# Patient Record
Sex: Male | Born: 1970 | Race: White | Hispanic: No | Marital: Married | Smoking: Never smoker
Health system: Southern US, Community
[De-identification: ages and names within clinical notes are randomized; demographics above are authoritative.]

## PROBLEM LIST (undated history)

## (undated) DIAGNOSIS — I10 Essential (primary) hypertension: Secondary | ICD-10-CM

## (undated) DIAGNOSIS — K219 Gastro-esophageal reflux disease without esophagitis: Secondary | ICD-10-CM

---

## 2013-09-11 ENCOUNTER — Other Ambulatory Visit: Payer: Self-pay

## 2013-11-29 ENCOUNTER — Emergency Department (HOSPITAL_COMMUNITY): Payer: 59

## 2013-11-29 ENCOUNTER — Emergency Department (HOSPITAL_COMMUNITY)
Admission: EM | Admit: 2013-11-29 | Discharge: 2013-11-29 | Disposition: A | Discharge status: Home / Self Care | Payer: 59 | Attending: Emergency Medicine | Admitting: Emergency Medicine

## 2013-11-29 ENCOUNTER — Encounter (HOSPITAL_COMMUNITY): Payer: Self-pay | Admitting: Emergency Medicine

## 2013-11-29 DIAGNOSIS — M25519 Pain in unspecified shoulder: Secondary | ICD-10-CM | POA: Insufficient documentation

## 2013-11-29 DIAGNOSIS — K219 Gastro-esophageal reflux disease without esophagitis: Secondary | ICD-10-CM | POA: Insufficient documentation

## 2013-11-29 DIAGNOSIS — R079 Chest pain, unspecified: Secondary | ICD-10-CM | POA: Insufficient documentation

## 2013-11-29 DIAGNOSIS — Z79899 Other long term (current) drug therapy: Secondary | ICD-10-CM | POA: Insufficient documentation

## 2013-11-29 DIAGNOSIS — I10 Essential (primary) hypertension: Secondary | ICD-10-CM | POA: Insufficient documentation

## 2013-11-29 DIAGNOSIS — R0789 Other chest pain: Secondary | ICD-10-CM | POA: Insufficient documentation

## 2013-11-29 DIAGNOSIS — M542 Cervicalgia: Secondary | ICD-10-CM | POA: Insufficient documentation

## 2013-11-29 HISTORY — DX: Essential (primary) hypertension: I10

## 2013-11-29 HISTORY — DX: Gastro-esophageal reflux disease without esophagitis: K21.9

## 2013-11-29 LAB — CBC
HCT: 49.2 % (ref 39.0–52.0)
HEMOGLOBIN: 18.1 g/dL — AB (ref 13.0–17.0)
MCH: 33.1 pg (ref 26.0–34.0)
MCHC: 36.8 g/dL — ABNORMAL HIGH (ref 30.0–36.0)
MCV: 89.9 fL (ref 78.0–100.0)
Platelets: 181 10*3/uL (ref 150–400)
RBC: 5.47 MIL/uL (ref 4.22–5.81)
RDW: 12.6 % (ref 11.5–15.5)
WBC: 7.4 10*3/uL (ref 4.0–10.5)

## 2013-11-29 LAB — BASIC METABOLIC PANEL
BUN: 8 mg/dL (ref 6–23)
CALCIUM: 9.2 mg/dL (ref 8.4–10.5)
CO2: 24 meq/L (ref 19–32)
Chloride: 101 mEq/L (ref 96–112)
Creatinine, Ser: 0.88 mg/dL (ref 0.50–1.35)
GFR calc Af Amer: >90 mL/min (ref >90)
GFR calc non Af Amer: >90 mL/min (ref >90)
GLUCOSE: 91 mg/dL (ref 70–99)
Potassium: 4.1 mEq/L (ref 3.7–5.3)
SODIUM: 139 meq/L (ref 137–147)

## 2013-11-29 LAB — POCT I-STAT TROPONIN I: Troponin i, poc: 0.01 ng/mL (ref 0.00–0.08)

## 2013-11-29 NOTE — ED Notes (Signed)
Rt shoulder pain since last week and tightness on left side started yesterday  No n/v/sob

## 2013-11-29 NOTE — Discharge Instructions (Signed)
Take an aspirin daily.  Chest Pain (Nonspecific) It is often hard to give a specific diagnosis for the cause of chest pain. There is always a chance that your pain could be related to something serious, such as a heart attack or a blood clot in the lungs. You need to follow up with your caregiver for further evaluation. CAUSES   Heartburn.  Pneumonia or bronchitis.  Anxiety or stress.  Inflammation around your heart (pericarditis) or lung (pleuritis or pleurisy).  A blood clot in the lung.  A collapsed lung (pneumothorax). It can develop suddenly on its own (spontaneous pneumothorax) or from injury (trauma) to the chest.  Shingles infection (herpes zoster virus). The chest wall is composed of bones, muscles, and cartilage. Any of these can be the source of the pain.  The bones can be bruised by injury.  The muscles or cartilage can be strained by coughing or overwork.  The cartilage can be affected by inflammation and become sore (costochondritis). DIAGNOSIS  Lab tests or other studies, such as X-rays, electrocardiography, stress testing, or cardiac imaging, may be needed to find the cause of your pain.  TREATMENT   Treatment depends on what may be causing your chest pain. Treatment may include:  Acid blockers for heartburn.  Anti-inflammatory medicine.  Pain medicine for inflammatory conditions.  Antibiotics if an infection is present.  You may be advised to change lifestyle habits. This includes stopping smoking and avoiding alcohol, caffeine, and chocolate.  You may be advised to keep your head raised (elevated) when sleeping. This reduces the chance of acid going backward from your stomach into your esophagus.  Most of the time, nonspecific chest pain will improve within 2 to 3 days with rest and mild pain medicine. HOME CARE INSTRUCTIONS   If antibiotics were prescribed, take your antibiotics as directed. Finish them even if you start to feel better.  For the  next few days, avoid physical activities that bring on chest pain. Continue physical activities as directed.  Do not smoke.  Avoid drinking alcohol.  Only take over-the-counter or prescription medicine for pain, discomfort, or fever as directed by your caregiver.  Follow your caregiver's suggestions for further testing if your chest pain does not go away.  Keep any follow-up appointments you made. If you do not go to an appointment, you could develop lasting (chronic) problems with pain. If there is any problem keeping an appointment, you must call to reschedule. SEEK MEDICAL CARE IF:   You think you are having problems from the medicine you are taking. Read your medicine instructions carefully.  Your chest pain does not go away, even after treatment.  You develop a rash with blisters on your chest. SEEK IMMEDIATE MEDICAL CARE IF:   You have increased chest pain or pain that spreads to your arm, neck, jaw, back, or abdomen.  You develop shortness of breath, an increasing cough, or you are coughing up blood.  You have severe back or abdominal pain, feel nauseous, or vomit.  You develop severe weakness, fainting, or chills.  You have a fever. THIS IS AN EMERGENCY. Do not wait to see if the pain will go away. Get medical help at once. Call your local emergency services (911 in [place] Do not drive yourself to the hospital. MAKE SURE YOU:   Understand these instructions.  Will watch your condition.  Will get help right away if you are not doing well or get worse. Document Released: 07/29/2005 Document Revised: 01/11/2012 Document Reviewed: 05/24/2008  ExitCare® Patient Information ©2014 [employer]. ° °

## 2013-11-29 NOTE — ED Provider Notes (Signed)
Medical screening examination/treatment/procedure(s) were performed by non-physician practitioner and as supervising physician I was immediately available for consultation/collaboration.  EKG Interpretation    Date/Time:  Wednesday November 29 2013 11:50:11 EST Ventricular Rate:  105 PR Interval:  164 QRS Duration: 82 QT Interval:  334 QTC Calculation: 441 R Axis:   60 Text Interpretation:  Sinus tachycardia with Fusion complexes Otherwise normal ECG Confirmed by DOCHERTY  MD, MEGAN ([id number]) on 11/29/2013 8:14:32 PM              Shanna CiscoMegan E Docherty, MD 11/29/13 2014

## 2013-11-29 NOTE — ED Provider Notes (Signed)
CSN: [id number]     Arrival date & time 11/29/13  1147 History   First MD Initiated Contact with Patient 11/29/13 1237     Chief Complaint  Patient presents with  . Shoulder Pain  . Chest Pain   (Consider location/radiation/quality/duration/timing/severity/associated sxs/prior Treatment) HPI Comments: Patient is a 43 year old male with a past medical history of hypertension and acid reflux who presents to the emergency department complaining of chest tightness x2 days. He describes the chest tightness as a "tickle" on the left side of his chest it is constant. Nothing in specific makes it worse or better. Denies worsening symptoms with inspiration. Denies shortness of breath, nausea, vomiting or diaphoresis. Symptoms are nonradiating. Patient also reports one week ago he had right-sided shoulder soreness beginning while moving a heavy smoker, worse with arm movement. 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 That symptom has since subsided. Denies ever having chest pain like this in the past. Regarding his elevated blood pressure, patient states his blood pressure usually runs around 150/100, he is taking his blood pressure medications as prescribed. States the symptom of chest pain as having a very anxious at this time. No family history of early heart disease. No family history of blood clots. Patient is a nonsmoker but breathes in a lot of smoke from an outdoor grill. Denies calf pain or tenderness.  Patient is a 43 y.o. male presenting with shoulder pain and chest pain. The history is provided by the patient.  Shoulder Pain Associated symptoms include chest pain.  Chest Pain   Past Medical History  Diagnosis Date  . Hypertension   . Acid reflux    History reviewed. No pertinent past surgical history. No family history on file. History  Substance Use Topics  . Smoking status: Never Smoker   . Smokeless tobacco: Not on file  . Alcohol Use: Yes     Review of Systems  Cardiovascular: Positive for chest pain.  Musculoskeletal:       Right shoulder pain, subsided.  All other systems reviewed and are negative.    Allergies  Review of patient's allergies indicates no known allergies.  Home Medications   Current Outpatient Rx  Name  Route  Sig  Dispense  Refill  . atorvastatin (LIPITOR) 40 MG tablet   Oral   Take 40 mg by mouth every morning.         Marland Kitchen ibuprofen (ADVIL) 200 MG tablet   Oral   Take 400 mg by mouth every 8 (eight) hours as needed (pain).          Marland Kitchen olmesartan (BENICAR) 40 MG tablet   Oral   Take 40 mg by mouth every morning.         Marland Kitchen omeprazole (PRILOSEC) 40 MG capsule   Oral   Take 40 mg by mouth 2 (two) times daily as needed (reflux).           BP 162/96  Pulse 74  Temp(Src) 99.2 F (37.3 C) (Oral)  Resp 18  SpO2 99% Physical Exam  Nursing note and vitals reviewed. Constitutional: He is oriented to person, place, and time. He appears well-developed and well-nourished. No distress.  HENT:  Head: Normocephalic and atraumatic.  Mouth/Throat: Oropharynx is clear and moist.  Eyes: Conjunctivae and EOM are normal. Pupils are equal, round, and reactive to light.  Neck: Normal range of motion. Neck supple.  Cardiovascular: Normal rate, regular rhythm, normal heart sounds  and intact distal pulses.   No extremity edema.  Pulmonary/Chest: Effort normal and breath sounds normal. No respiratory distress. He exhibits no tenderness.  Abdominal: Soft. Bowel sounds are normal. There is no tenderness.  Musculoskeletal: Normal range of motion. He exhibits no edema.  Right shoulder non-tender, normal ROM.  Neurological: He is alert and oriented to person, place, and time. He has normal strength. No sensory deficit.  Skin: Skin is warm and dry. He is not diaphoretic.  Psychiatric: He has a normal mood and affect. His behavior is normal.    ED Course  Procedures (including critical care time) Labs  Review Labs Reviewed  CBC - Abnormal; Notable for the following:    Hemoglobin 18.1 (*)    MCHC 36.8 (*)    All other components within normal limits  BASIC METABOLIC PANEL  POCT I-STAT TROPONIN I   Imaging Review Dg Chest 2 View  11/29/2013   CLINICAL DATA:  Chest pain.  EXAM: CHEST  2 VIEW  COMPARISON:  None.  FINDINGS: The lungs are adequately inflated. There is no focal infiltrate. The interstitial markings are mildly prominent bilaterally but there are no previous studies with which to compare. The lung markings are coarse in the lower retrocardiac region on the lateral film. These are not clearly evident on the frontal film and are likely due to prominent vascularity. There is no pleural effusion or pneumothorax. The cardiac silhouette is normal in size. The pulmonary vascularity is not engorged. The mediastinum is normal in width. The observed portions of the bony thorax appear normal.  IMPRESSION: There is no evidence of CHF nor pneumonia. Minimal prominence of the interstitial markings bilaterally is nonspecific and may reflect a smoking history if any. One cannot exclude acute bronchitis in the appropriate clinical setting.   Electronically Signed   By: David  SwazilandJordan   On: 11/29/2013 14:15    EKG Interpretation   None       MDM   1. Chest pain    Presenting with left-sided chest pain described as "tingling". He is well appearing and in no apparent distress. Hypertensive on arrival, 162/96 after sitting for a while. Patient states his pain is "not that bad" and believes he is over thinking it causing himself anxiety. He is low risk heart score of 1. He was tachycardic on his EKG, however on my exam his pulse was 75 to 85. PERC negative. Troponin negative. Chest x-ray showing mild prominence of interstitial markings, he does not have any symptoms of bronchitis, nonsmoker. He is stable for discharge home with followup per primary care physician. I advised him to discuss possible change  of blood pressure medications. Return precautions given. Patient states understanding of treatment care plan and is agreeable.     Trevor MaceRobyn M Albert, PA-C 11/29/13 [phone]

## 2021-11-20 ENCOUNTER — Other Ambulatory Visit: Payer: Self-pay

## 2021-11-20 ENCOUNTER — Other Ambulatory Visit (HOSPITAL_COMMUNITY): Payer: Self-pay | Admitting: Emergency Medicine

## 2021-11-20 ENCOUNTER — Ambulatory Visit (HOSPITAL_COMMUNITY)
Admission: RE | Admit: 2021-11-20 | Discharge: 2021-11-20 | Disposition: A | Discharge status: Home / Self Care | Payer: BC Managed Care – PPO | Source: Ambulatory Visit | Attending: Emergency Medicine | Admitting: Emergency Medicine

## 2021-11-20 DIAGNOSIS — R42 Dizziness and giddiness: Secondary | ICD-10-CM | POA: Insufficient documentation

## 2021-11-20 DIAGNOSIS — R0602 Shortness of breath: Secondary | ICD-10-CM | POA: Insufficient documentation

## 2021-11-20 MED ORDER — IOHEXOL 350 MG/ML SOLN
100.0000 mL | Freq: Once | INTRAVENOUS | Status: AC | PRN
  Administered 2021-11-20: 100 mL via INTRAVENOUS

## 2021-12-19 ENCOUNTER — Ambulatory Visit (HOSPITAL_BASED_OUTPATIENT_CLINIC_OR_DEPARTMENT_OTHER): Payer: BC Managed Care – PPO | Admitting: Cardiology

## 2021-12-26 ENCOUNTER — Ambulatory Visit (HOSPITAL_BASED_OUTPATIENT_CLINIC_OR_DEPARTMENT_OTHER): Payer: BC Managed Care – PPO | Admitting: Cardiology

## 2021-12-26 ENCOUNTER — Encounter (HOSPITAL_BASED_OUTPATIENT_CLINIC_OR_DEPARTMENT_OTHER): Payer: Self-pay | Admitting: Cardiology

## 2021-12-26 ENCOUNTER — Other Ambulatory Visit: Payer: Self-pay

## 2021-12-26 VITALS — BP 168/100 | HR 100 | Ht 71.0 in | Wt 275.4 lb

## 2021-12-26 DIAGNOSIS — Z5181 Encounter for therapeutic drug level monitoring: Secondary | ICD-10-CM

## 2021-12-26 DIAGNOSIS — E669 Obesity, unspecified: Secondary | ICD-10-CM

## 2021-12-26 DIAGNOSIS — Z7189 Other specified counseling: Secondary | ICD-10-CM | POA: Diagnosis not present

## 2021-12-26 DIAGNOSIS — I10 Essential (primary) hypertension: Secondary | ICD-10-CM | POA: Diagnosis not present

## 2021-12-26 DIAGNOSIS — E782 Mixed hyperlipidemia: Secondary | ICD-10-CM

## 2021-12-26 DIAGNOSIS — R002 Palpitations: Secondary | ICD-10-CM | POA: Diagnosis not present

## 2021-12-26 DIAGNOSIS — E1169 Type 2 diabetes mellitus with other specified complication: Secondary | ICD-10-CM

## 2021-12-26 MED ORDER — SPIRONOLACTONE 25 MG PO TABS: 25.0000 mg | ORAL_TABLET | Freq: Every day | ORAL | 3 refills | Status: DC

## 2021-12-26 MED ORDER — CHLORTHALIDONE 25 MG PO TABS: 25.0000 mg | ORAL_TABLET | Freq: Every day | ORAL | 3 refills | Status: DC

## 2021-12-26 NOTE — Progress Notes (Incomplete)
Cardiology Office Note:    Date:  12/26/2021   ID:  Scott Foster, DOB [birth year], MRN [id number]  PCP:  Gwenlyn Found, MD  Cardiologist:  Jodelle Red, MD  Referring MD: Gwenlyn Found, MD   No chief complaint on file.  History of Present Illness:    Scott Foster is a 51 y.o. male with a hx of hypertension, arthritis, and acid reflux, who is seen as a new consult at the request of [name], Nira Retort, MD for the evaluation and management of palpitations.  Cardiac history: [name] presented to the [facility] ED on 11/20/2021 for palpitations. He also noted leg weakness, shortness of breath, dizziness, tachycardia, and abdominal tenderness on examination. It was recommended he follow-up with cardiology due to his palpitations.  Overall, he is mostly concerned that his blood pressure is not staying consistent. Sometimes he checks this at home, but most of the time he is able to tell such as when he wakes up with throbbing headaches. His high blood pressure also occurs randomly during the day. Today his high blood pressure was noticeable for 3 distinct episodes. He is not sure how long this lasts typically.   His hypertensive episodes have become more frequent in the past few months, but did occur from time to time before then. This type of episode had prompted his ED visit 11/20/2021. He explains that his other symptoms were subsequent to his hypertension and headaches.  Cardiovascular risk factors: Prior clinical ASCVD: None Comorbid conditions: Hypertension - On antihypertensives since 2005. Currently he is taking Azor, and he does not take Benicar. Also on metoprolol and 50 mg HCTZ. He denies ever struggling with very low blood pressures. Diabetes - A little over a year. Metabolic syndrome/Obesity: Highest adult weight 290 lbs. Current weight is 275 lbs. He went off of Trulicity due to side effects, and had lost weight down to 260 lbs. He regained weight by eating too much  as he no longer felt nausea caused by the Trulicity. Chronic inflammatory conditions: Tobacco use history: Former smoker, quit in 1992. Family history: HTN, diabetes. His paternal grandfather died of a stroke in his 58s. His maternal uncle died in his 45s due to a stroke.  Prior cardiac testing and/or incidental findings on other testing (ie coronary calcium): ECG 11/20/2021 from [facility] showed Sinus tachycardia at 108 bpm with no acute ischemic changes. Cardiomegaly noted on CTA of chest the same day. Troponins reassuring and flat. Attempted stress test previously, unable to complete due to pain in his hamstrings. Exercise level: Trying to work on increasing his exercise. Sometimes his leg weakness significantly limits his walking. He takes Advil and Aleve very sparingly. Current diet:   He endorses mild LE edema at the ankles. This was more of an issue in the past. He does well with monitoring his sodium intake.  He denies any chest pain, or shortness of breath. No lightheadedness, syncope, orthopnea, or PND.  Past Medical History:  Diagnosis Date   Acid reflux    Hypertension     History reviewed. No pertinent surgical history.  Current Medications: Current Outpatient Medications on File Prior to Visit  Medication Sig   ALPRAZolam (XANAX) 0.25 MG tablet Take 0.25 mg by mouth 2 (two) times daily as needed.   amLODipine-olmesartan (AZOR) 10-40 MG tablet Take 1 tablet by mouth daily.   atorvastatin (LIPITOR) 80 MG tablet Take 80 mg by mouth at bedtime.   Cyanocobalamin 1000 MCG SUBL Place under the  tongue.   metoprolol tartrate (LOPRESSOR) 25 MG tablet Take 1 tablet by mouth 2 (two) times daily.   omeprazole (PRILOSEC) 40 MG capsule Take 40 mg by mouth 2 (two) times daily as needed (reflux).    ondansetron (ZOFRAN-ODT) 8 MG disintegrating tablet Take 8 mg by mouth every 8 (eight) hours as needed.   pioglitazone (ACTOS) 15 MG tablet Take by mouth.   No current  facility-administered medications on file prior to visit.     Allergies:   Patient has no known allergies.   Social History   Tobacco Use   Smoking status: Never  Substance Use Topics   Alcohol use: Yes    Family History: family history is not on file.  ROS:   Please see the history of present illness.  Additional pertinent ROS: Constitutional: Negative for chills, fever, night sweats, unintentional weight loss  HENT: Negative for ear pain and hearing loss. Positive for headaches.  Eyes: Negative for loss of vision and eye pain.  Respiratory: Negative for cough, sputum, wheezing.   Cardiovascular: See HPI. Gastrointestinal: Negative for abdominal pain, melena, and hematochezia.  Genitourinary: Negative for dysuria and hematuria.  Musculoskeletal: Negative for falls and myalgias.  Skin: Negative for itching and rash.  Neurological: Negative for focal sensory changes and loss of consciousness. Positive for bilateral LE weakness. Endo/Heme/Allergies: Does not bruise/bleed easily.     EKGs/Labs/Other Studies Reviewed:    The following studies were reviewed today:  CTA Chest 11/20/2021: FINDINGS: Cardiovascular: Examination for pulmonary embolism is somewhat limited by breath motion artifact and marginal contrast bolus, main pulmonary artery = 200 HU. Within this limitation, no evidence of pulmonary embolism through the segmental pulmonary arterial level. Cardiomegaly. No pericardial effusion. Aortic atherosclerosis.   Mediastinum/Nodes: No enlarged mediastinal, hilar, or axillary lymph nodes. Thyroid gland, trachea, and esophagus demonstrate no significant findings.   Lungs/Pleura: Lungs are clear. No pleural effusion or pneumothorax.   Upper Abdomen: Please see separately reported examination of the abdomen and pelvis.   Musculoskeletal: No chest wall abnormality. No acute osseous findings.   Review of the MIP images confirms the above findings.   IMPRESSION: 1.  Examination for pulmonary embolism is somewhat limited by breath motion artifact and marginal contrast bolus, main pulmonary artery = 200 HU. Within this limitation, no evidence of pulmonary embolism through the segmental pulmonary arterial level. 2. Cardiomegaly.   Aortic Atherosclerosis (ICD10-I70.0).  EKG:  EKG is personally reviewed.   12/26/2021: ***  Recent Labs: No results found for requested labs within last 8760 hours.   Recent Lipid Panel No results found for: CHOL, TRIG, HDL, CHOLHDL, VLDL, LDLCALC, LDLDIRECT  Physical Exam:    VS:  BP (!) 168/100 (BP Location: Right Arm, Patient Position: Sitting, Cuff Size: Large)    Pulse 100    Ht 5\' 11"  (1.803 m)    Wt 275 lb 6.4 oz (124.9 kg)    SpO2 95%    BMI 38.41 kg/m     Wt Readings from Last 3 Encounters:  12/26/21 275 lb 6.4 oz (124.9 kg)    GEN: Well nourished, well developed in no acute distress HEENT: Normal, moist mucous membranes NECK: No JVD CARDIAC: regular rhythm, normal S1 and S2, no rubs or gallops. No murmur. VASCULAR: Radial and DP pulses 2+ bilaterally. No carotid bruits RESPIRATORY:  Clear to auscultation without rales, wheezing or rhonchi  ABDOMEN: Soft, non-tender, non-distended MUSCULOSKELETAL:  Ambulates independently SKIN: Warm and dry, no edema NEUROLOGIC:  Alert and oriented x 3. No focal  neuro deficits noted. PSYCHIATRIC:  Normal affect    ASSESSMENT:    1. Resistant hypertension   2. Therapeutic drug monitoring    PLAN:     Cardiac risk counseling and prevention recommendations: -recommend heart healthy/Mediterranean diet, with whole grains, fruits, vegetable, fish, lean meats, nuts, and olive oil. Limit salt. -recommend moderate walking, 3-5 times/week for 30-50 minutes each session. Aim for at least 150 minutes.week. Goal should be pace of 3 miles/hours, or walking 1.5 miles in 30 minutes -recommend avoidance of tobacco products. Avoid excess alcohol. -ASCVD risk score: The 10-year  ASCVD risk score (Arnett DK, et al., 2019) is: 14.5%   Values used to calculate the score:     Age: 51 years     Sex: Male     Is Non-Hispanic African American: No     Diabetic: Yes     Tobacco smoker: No     Systolic Blood Pressure: [id number] mmHg     Is BP treated: Yes     HDL Cholesterol: 39 MG/DL     Total Cholesterol: 164 MG/DL    Plan for follow up: 4-6 weeks or sooner as needed.  Buford Dresser, MD, PhD, [facility]    Medication Adjustments/Labs and Tests Ordered: Current medicines are reviewed at length with the patient today.  Concerns regarding medicines are outlined above.   Orders Placed This Encounter  Procedures   Basic Metabolic Panel (BMET)   EKG 12-Lead   Meds ordered this encounter  Medications   chlorthalidone (HYGROTON) 25 MG tablet    Sig: Take 1 tablet (25 mg total) by mouth daily.    Dispense:  90 tablet    Refill:  3   spironolactone (ALDACTONE) 25 MG tablet    Sig: Take 1 tablet (25 mg total) by mouth daily.    Dispense:  90 tablet    Refill:  3   Patient Instructions  From Today: Stop hydrochlorothiazide. We are starting chlorthalidone in its place. Stop potassium. We are starting spironolactone, which helps with potassium.  LABS (BMET) IN 1 WEEK   FOLLOW UP 02/02/2022 AT 4:20 PM WITH DR Harrell Gave   -how to check blood pressure:  -sit comfortably in a chair, feet uncrossed and flat on floor, for 5-10 minutes  -arm ideally should rest at the level of the heart. However, arm should be relaxed and not tense (for example, do not hold the arm up unsupported)  -avoid exercise, caffeine, and tobacco for at least 30 minutes prior to BP reading  -don't take BP cuff reading over clothes (always place on skin directly)  -I prefer to know how well the medication is working, so I would like you to take your readings 1-2 hours after taking your blood pressure medication if possible   I like arm cuffs better than wrist cuffs for  checking blood pressure, they are more accurate.   I,Mathew Stumpf,acting as a Education administrator for [employer], MD.,have documented all relevant documentation on the behalf of Buford Dresser, MD,as directed by  Buford Dresser, MD while in the presence of Buford Dresser, MD.  ***  Signed, Buford Dresser, MD PhD 12/26/2021 5:42 PM    [place]

## 2021-12-26 NOTE — Patient Instructions (Addendum)
From Today: Stop hydrochlorothiazide. We are starting chlorthalidone in its place. Stop potassium. We are starting spironolactone, which helps with potassium.  LABS (BMET) IN 1 WEEK   FOLLOW UP 02/02/2022 AT 4:20 PM WITH DR Cristal Deer   -how to check blood pressure:  -sit comfortably in a chair, feet uncrossed and flat on floor, for 5-10 minutes  -arm ideally should rest at the level of the heart. However, arm should be relaxed and not tense (for example, do not hold the arm up unsupported)  -avoid exercise, caffeine, and tobacco for at least 30 minutes prior to BP reading  -don't take BP cuff reading over clothes (always place on skin directly)  -I prefer to know how well the medication is working, so I would like you to take your readings 1-2 hours after taking your blood pressure medication if possible   I like arm cuffs better than wrist cuffs for checking blood pressure, they are more accurate.

## 2022-01-17 LAB — BASIC METABOLIC PANEL
BUN/Creatinine Ratio: 21 — ABNORMAL HIGH (ref 9–20)
BUN: 16 mg/dL (ref 6–24)
CO2: 25 mmol/L (ref 20–29)
Calcium: 10.1 mg/dL (ref 8.7–10.2)
Chloride: 97 mmol/L (ref 96–106)
Creatinine, Ser: 0.78 mg/dL (ref 0.76–1.27)
Glucose: 137 mg/dL — ABNORMAL HIGH (ref 70–99)
Potassium: 3.8 mmol/L (ref 3.5–5.2)
Sodium: 136 mmol/L (ref 134–144)
eGFR: 107 mL/min/{1.73_m2} (ref >59)

## 2022-02-02 ENCOUNTER — Ambulatory Visit (HOSPITAL_BASED_OUTPATIENT_CLINIC_OR_DEPARTMENT_OTHER): Payer: BC Managed Care – PPO | Admitting: Cardiology

## 2022-02-02 ENCOUNTER — Encounter (HOSPITAL_BASED_OUTPATIENT_CLINIC_OR_DEPARTMENT_OTHER): Payer: Self-pay | Admitting: Cardiology

## 2022-02-02 VITALS — BP 119/70 | HR 97 | Ht 71.0 in | Wt 276.3 lb

## 2022-02-02 DIAGNOSIS — Z7189 Other specified counseling: Secondary | ICD-10-CM | POA: Diagnosis not present

## 2022-02-02 DIAGNOSIS — R600 Localized edema: Secondary | ICD-10-CM

## 2022-02-02 DIAGNOSIS — Z7182 Exercise counseling: Secondary | ICD-10-CM

## 2022-02-02 DIAGNOSIS — I10 Essential (primary) hypertension: Secondary | ICD-10-CM | POA: Diagnosis not present

## 2022-02-02 DIAGNOSIS — E669 Obesity, unspecified: Secondary | ICD-10-CM

## 2022-02-02 DIAGNOSIS — E782 Mixed hyperlipidemia: Secondary | ICD-10-CM

## 2022-02-02 DIAGNOSIS — E1169 Type 2 diabetes mellitus with other specified complication: Secondary | ICD-10-CM

## 2022-02-02 NOTE — Patient Instructions (Signed)
Medication Instructions:  ?Your Physician recommend you continue on your current medication as directed.   ? ?*If you need a refill on your cardiac medications before your next appointment, please call your pharmacy* ? ? ?Lab Work: ?Your provider has recommended lab work in October, 2023 (Lipid). Please have this collected at [facility] at [place]. The lab is open 8:00 am - 4:30 pm. Please avoid 12:00p - 1:00p for lunch hour. You do not need an appointment. Please go to [address] [place] [facility], [place] [address]. This is in the Primary Care office on the 3rd floor, let them know you are there for blood work and they will direct you to the lab. ? ?If you have labs (blood work) drawn today and your tests are completely normal, you will receive your results only by: ?MyChart Message (if you have MyChart) OR ?A paper copy in the mail ?If you have any lab test that is abnormal or we need to change your treatment, we will call you to review the results. ? ? ?Testing/Procedures: ?None ordered today ? ? ?Follow-Up: ?At [facility], you and your health needs are our priority.  As part of our continuing mission to provide you with exceptional heart care, we have created designated Provider Care Teams.  These Care Teams include your primary Cardiologist (physician) and Advanced Practice Providers (APPs -  Physician Assistants and Nurse Practitioners) who all work together to provide you with the care you need, when you need it. ? ?We recommend signing up for the patient portal called "MyChart".  Sign up information is provided on this After Visit Summary.  MyChart is used to connect with patients for Virtual Visits (Telemedicine).  Patients are able to view lab/test results, encounter notes, upcoming appointments, etc.  Non-urgent messages can be sent to your provider as well.   ?To learn more about what you can do with MyChart, go to [email or web address].   ? ?Your next appointment:   ?1  year(s) ? ?The format for your next appointment:   ?In Person ? ?Provider:   ?Buford Dresser, MD{ ? ? ?

## 2022-02-02 NOTE — Progress Notes (Signed)
?Cardiology Office Note:   ? ?Date:  02/02/2022  ? ?ID:  Scott Foster, DOB 12-30-1970, MRN [id number] ? ?PCP:  Nickola Major, MD  ?Cardiologist:  Buford Dresser, MD ? ?CC: follow up ? ?History of Present Illness:   ? ?Scott Foster is a 51 y.o. male with a hx of hypertension, arthritis, and acid reflux, who is seen for follow-up. I initially met him 12/26/2021 as a new consult for the evaluation and management of palpitations. His blood pressure was elevated at first visit, and he noted labile readings at home.  ? ?Cardiovascular risk factors: ?Hypertension since 2005 ?Diabetes since ~2022. Did not tolerate Trulicity (nausea) but did have about 30 lb weight loss due to side effects. Has also been on Jardiance in the past.  ?Former smoker, quit in 1992. ?Family history: HTN, diabetes. His paternal grandfather died of a stroke in his 60s. His maternal uncle died in his 45s due to a stroke.  ? ?Today: ?Overall,the patient is appears to be feeling well. He denies any recent palpitations. ? ?He reports that he is tolerating his medications well. About 3 weeks ago his diabetic medication was increased, causing side effects of nausea for 3 days. Sometimes he still notices nausea in the mornings, but taking Zofran helps relieve his symptoms.  ? ?The patient's current blood sugar averages 130. The patients last A1C was 7.8. A few months ago he had a binge on sugar and his blood sugars increased to 425. He denies being symptomatic at that time. Typically he tries to limit the sugar in his diet. ? ?He does not administer blood pressure checks at home in order to eliminate stress. His blood pressure is now in a normal range. Sometimes he feels "a little weird," but he attributes this to his body needing to adjust to lower blood pressures. 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 ? ?The patient is working hard to be active and diet more. He has continued issues with bilateral LE  weakness when being active. His current diet includes vegetables and meat.  ? ?From his previous blood testing his LDL and triglycerides were elevated although he fasted. He is tolerating atorvastatin. ? ?He denies any chest pain, shortness of breath, or peripheral edema. No lightheadedness, headaches, syncope, orthopnea, or PND. ? ? ?Past Medical History:  ?Diagnosis Date  ? Acid reflux   ? Hypertension   ? ? ?History reviewed. No pertinent surgical history. ? ?Current Medications: ?Current Outpatient Medications on File Prior to Visit  ?Medication Sig  ? ALPRAZolam (XANAX) 0.25 MG tablet Take 0.25 mg by mouth 2 (two) times daily as needed.  ? amLODipine-olmesartan (AZOR) 10-40 MG tablet Take 1 tablet by mouth daily.  ? atorvastatin (LIPITOR) 80 MG tablet Take 80 mg by mouth at bedtime.  ? chlorthalidone (HYGROTON) 25 MG tablet Take 1 tablet (25 mg total) by mouth daily.  ? Cyanocobalamin 1000 MCG SUBL Place under the tongue.  ? metoprolol tartrate (LOPRESSOR) 25 MG tablet Take 1 tablet by mouth 2 (two) times daily.  ? omeprazole (PRILOSEC) 40 MG capsule Take 40 mg by mouth 2 (two) times daily as needed (reflux).   ? ondansetron (ZOFRAN-ODT) 8 MG disintegrating tablet Take 8 mg by mouth every 8 (eight) hours as needed.  ? pioglitazone (ACTOS) 15 MG tablet Take by mouth.  ? spironolactone (ALDACTONE) 25 MG tablet Take 1 tablet (25 mg total) by mouth daily.  ? ?No current facility-administered medications on file prior  to visit.  ?  ? ?Allergies:   Patient has no known allergies.  ? ?Social History  ? ?Tobacco Use  ? Smoking status: Never  ?Substance Use Topics  ? Alcohol use: Yes  ? ? ?Family History: ?HTN, diabetes. His paternal grandfather died of a stroke in his 9s. His maternal uncle died in his 24s due to a stroke.  ? ?ROS:   ?Please see the history of present illness. ?All other systems are reviewed and negative.  ? ? ?EKGs/Labs/Other Studies Reviewed:   ? ?The following studies were reviewed today: ? ?CTA  Chest 11/20/2021: ?FINDINGS: ?Cardiovascular: Examination for pulmonary embolism is somewhat ?limited by breath motion artifact and marginal contrast bolus, main ?pulmonary artery = 200 HU. Within this limitation, no evidence of ?pulmonary embolism through the segmental pulmonary arterial level. ?Cardiomegaly. No pericardial effusion. Aortic atherosclerosis. ?  ?Mediastinum/Nodes: No enlarged mediastinal, hilar, or axillary lymph ?nodes. Thyroid gland, trachea, and esophagus demonstrate no ?significant findings. ?  ?Lungs/Pleura: Lungs are clear. No pleural effusion or pneumothorax. ?  ?Upper Abdomen: Please see separately reported examination of the ?abdomen and pelvis. ?  ?Musculoskeletal: No chest wall abnormality. No acute osseous ?findings. ?  ?Review of the MIP images confirms the above findings. ?  ?IMPRESSION: ?1. Examination for pulmonary embolism is somewhat limited by breath ?motion artifact and marginal contrast bolus, main pulmonary artery = ?200 HU. Within this limitation, no evidence of pulmonary embolism ?through the segmental pulmonary arterial level. ?2. Cardiomegaly. ?  ?Aortic Atherosclerosis (ICD10-I70.0). ? ?EKG:  EKG is personally reviewed.   ?02/02/2022: EKG was not ordered. ?12/26/2021: NSR at 100 bpm ? ?Recent Labs: ?01/16/2022: BUN 16; Creatinine, Ser 0.78; Potassium 3.8; Sodium 136  ? ?Recent Lipid Panel ?No results found for: CHOL, TRIG, HDL, CHOLHDL, VLDL, LDLCALC, LDLDIRECT ? ?Physical Exam:   ? ?VS:  BP 119/70   Pulse 97   Ht 5' 11"  (1.803 m)   Wt 276 lb 4.8 oz (125.3 kg)   SpO2 95%   BMI 38.54 kg/m?    ? ?Wt Readings from Last 3 Encounters:  ?02/02/22 276 lb 4.8 oz (125.3 kg)  ?12/26/21 275 lb 6.4 oz (124.9 kg)  ?  ?GEN: Well nourished, well developed in no acute distress ?HEENT: Normal, moist mucous membranes ?NECK: No JVD ?CARDIAC: regular rhythm, normal S1 and S2, no rubs or gallops. No murmur. ?VASCULAR: Radial and DP pulses 2+ bilaterally. No carotid bruits ?RESPIRATORY:  Clear  to auscultation without rales, wheezing or rhonchi  ?ABDOMEN: Soft, non-tender, non-distended ?MUSCULOSKELETAL:  Ambulates independently ?SKIN: Warm and dry, no edema ?NEUROLOGIC:  Alert and oriented x 3. No focal neuro deficits noted. ?PSYCHIATRIC:  Normal affect   ? ?ASSESSMENT:   ? ?1. Essential hypertension   ?2. Diabetes mellitus type 2 in obese [facility])   ?3. Mixed hyperlipidemia   ?4. Cardiac risk counseling   ?5. Exercise counseling   ?6. Bilateral leg edema   ? ?PLAN:   ? ?Hypertension ?-BP now normalized with changing HCTZ to chlorthalidone and adding spironolactone ?-continue amlodipine-olmesartan ? ?LE edema: improved today ?-pioglitazone is known to be associated with fluid retention, but he has not tolerated many other classes of diabetes agents. No known heart failure ? ?Type II diabetes ?Obesity, BMI 38 ?-did not tolerate Trulicty (HFW2OV) due to nausea. Reports being on Jardiance in the past, per notes no side effects but did not improve his blood sugar ?-discuss aspirin given diabetes ? ?Mixed hyperlipidemia: ?-on atorvastatin 80 mg daily ?-last LDL 40, but last  TG 683 07/25/21. Does report that he was fasting ?-counseled on diet and especially exercise ?-if TG remain elevated at 6 mos recheck, will need to discuss additional TG lowering therapies ? ?Cardiac risk counseling and prevention recommendations: ?-recommend heart healthy/Mediterranean diet, with whole grains, fruits, vegetable, fish, lean meats, nuts, and olive oil. Limit salt. ?-recommend moderate walking, 3-5 times/week for 30-50 minutes each session. Aim for at least 150 minutes.week. Goal should be pace of 3 miles/hours, or walking 1.5 miles in 30 minutes ?-recommend avoidance of tobacco products. Avoid excess alcohol. ?-ASCVD risk score: ?The 10-year ASCVD risk score (Arnett DK, et al., 2019) is: 8.1% ?  Values used to calculate the score: ?    Age: 29 years ?    Sex: Male ?    Is Non-Hispanic African American: No ?    Diabetic: Yes ?     Tobacco smoker: No ?    Systolic Blood Pressure: 561 mmHg ?    Is BP treated: Yes ?    HDL Cholesterol: 39 MG/DL ?    Total Cholesterol: 164 MG/DL   ? ?Plan for follow up: 1 year if BP/TG controlled, sooner if

## 2022-08-08 LAB — LIPID PANEL
Chol/HDL Ratio: 5.8 ratio — ABNORMAL HIGH (ref 0.0–5.0)
Cholesterol, Total: 210 mg/dL — ABNORMAL HIGH (ref 100–199)
HDL: 36 mg/dL — ABNORMAL LOW (ref >39)
LDL Chol Calc (NIH): 120 mg/dL — ABNORMAL HIGH (ref 0–99)
Triglycerides: 310 mg/dL — ABNORMAL HIGH (ref 0–149)
VLDL Cholesterol Cal: 54 mg/dL — ABNORMAL HIGH (ref 5–40)

## 2023-01-18 ENCOUNTER — Other Ambulatory Visit (HOSPITAL_BASED_OUTPATIENT_CLINIC_OR_DEPARTMENT_OTHER): Payer: Self-pay | Admitting: Cardiology

## 2023-01-18 DIAGNOSIS — I1A0 Resistant hypertension: Secondary | ICD-10-CM

## 2023-01-18 NOTE — Telephone Encounter (Signed)
Rx(s) sent to pharmacy electronically.  

## 2023-02-08 ENCOUNTER — Encounter (HOSPITAL_BASED_OUTPATIENT_CLINIC_OR_DEPARTMENT_OTHER): Payer: Self-pay | Admitting: Cardiology

## 2023-02-08 ENCOUNTER — Ambulatory Visit (HOSPITAL_BASED_OUTPATIENT_CLINIC_OR_DEPARTMENT_OTHER): Payer: BC Managed Care – PPO | Admitting: Cardiology

## 2023-02-08 VITALS — BP 110/72 | HR 85 | Ht 71.0 in | Wt 283.8 lb

## 2023-02-08 DIAGNOSIS — E1140 Type 2 diabetes mellitus with diabetic neuropathy, unspecified: Secondary | ICD-10-CM | POA: Diagnosis not present

## 2023-02-08 DIAGNOSIS — Z7189 Other specified counseling: Secondary | ICD-10-CM | POA: Diagnosis not present

## 2023-02-08 DIAGNOSIS — I10 Essential (primary) hypertension: Secondary | ICD-10-CM

## 2023-02-08 DIAGNOSIS — E782 Mixed hyperlipidemia: Secondary | ICD-10-CM

## 2023-02-08 NOTE — Patient Instructions (Addendum)
Medication Instructions:  Your physician recommends that you continue on your current medications as directed. Please refer to the Current Medication list given to you today.  *If you need a refill on your cardiac medications before your next appointment, please call your pharmacy*  Lab Work: NONE  Testing/Procedures: NONE  Follow-Up: At [facility], you and your health needs are our priority.  As part of our continuing mission to provide you with exceptional heart care, we have created designated Provider Care Teams.  These Care Teams include your primary Cardiologist (physician) and Advanced Practice Providers (APPs -  Physician Assistants and Nurse Practitioners) who all work together to provide you with the care you need, when you need it.  We recommend signing up for the patient portal called "MyChart".  Sign up information is provided on this After Visit Summary.  MyChart is used to connect with patients for Virtual Visits (Telemedicine).  Patients are able to view lab/test results, encounter notes, upcoming appointments, etc.  Non-urgent messages can be sent to your provider as well.   To learn more about what you can do with MyChart, go to [email or web address].    Your next appointment:   12  month(s)  The format for your next appointment:   In Person  Provider:   Jodelle Red, MD    Once your GI workup is complete, consider starting aspirin 81 mg daily for prevention given your diabetes.

## 2023-02-08 NOTE — Progress Notes (Signed)
Cardiology Office Note:    Date:  02/08/2023   ID:  Scott Foster, DOB 06/03/1971, MRN [id number]  PCP:  Gwenlyn FoundEksir, Samantha A, MD  Cardiologist:  Jodelle RedBridgette Mariamawit Depaoli, MD  CC: follow up  History of Present Illness:    Scott BurrowJimmy L Collums is a 52 y.o. male with a hx of hypertension, arthritis, and acid reflux, who is seen for follow-up. I initially met him 12/26/2021 as a new consult for the evaluation and management of palpitations. His blood pressure was elevated at first visit, and he noted labile readings at home.   Cardiovascular risk factors: Hypertension since 2005 Diabetes since ~2022. Did not tolerate Trulicity (nausea) but did have about 30 lb weight loss due to side effects. Has also been on Jardiance in the past.  Former smoker, quit in 1992. Family history: HTN, diabetes. His paternal grandfather died of a stroke in his 470s. His maternal uncle died in his 940s due to a stroke.   At his last appointment, he denied recent palpitations. His blood sugars averaged 130 and his A1C was 7.8. We discussed aspirin given diabetes. He was not monitoring home blood pressures in order to eliminate stress. His blood pressure was normalized with switching HCTZ to chlorthalidone, and adding spironolactone. He continued to struggle with LE weakness when active, but was working on increasing his exercise.   Today, he is feeling good. A few weeks ago he was started on Cymbalta. This seems to be helping with his neuropathy. However, he does note some minor changes in his sleep quality and he has experienced mild headaches.  His blood pressure is well controlled in clinic today at 110/72. He has had some slightly higher readings; he reports a reading of 135/90 at his last PCP visit. At home his blood pressures are mostly averaging 110-115 systolic. He notes that it was 107/71 prior to his colonoscopy in 12/2022.  He recalls that he was fasting for his lipid panel in October. At the time he had more meat in his  diet to address his GI issues. His last 3 incidents seemed to correlate with eating salads. He also reports that he had more recent lab work a few weeks ago. His triglycerides did improve significantly.  He complains of ED as a side effect of his medications.  He denies any palpitations, chest pain, shortness of breath, or peripheral edema. No syncope, orthopnea, or PND.   Past Medical History:  Diagnosis Date   Acid reflux    Hypertension     No past surgical history on file.  Current Medications: Current Outpatient Medications on File Prior to Visit  Medication Sig   ALPRAZolam (XANAX) 0.25 MG tablet Take 0.25 mg by mouth 2 (two) times daily as needed.   amLODipine-olmesartan (AZOR) 10-40 MG tablet Take 1 tablet by mouth daily.   atorvastatin (LIPITOR) 80 MG tablet Take 80 mg by mouth at bedtime.   chlorthalidone (HYGROTON) 25 MG tablet TAKE 1 TABLET (25 MG TOTAL) BY MOUTH DAILY.   dicyclomine (BENTYL) 20 MG tablet Take 20 mg by mouth as needed for spasms.   DULoxetine (CYMBALTA) 20 MG capsule Take 20 mg by mouth daily.   metoprolol tartrate (LOPRESSOR) 25 MG tablet Take 1 tablet by mouth 2 (two) times daily.   omeprazole (PRILOSEC) 40 MG capsule Take 40 mg by mouth 2 (two) times daily as needed (reflux).    ondansetron (ZOFRAN-ODT) 8 MG disintegrating tablet Take 8 mg by mouth every 8 (eight) hours as needed.  OZEMPIC, 2 MG/DOSE, 8 MG/3ML SOPN Inject 2 mg into the skin once a week.   pioglitazone (ACTOS) 45 MG tablet Take 45 mg by mouth daily.   spironolactone (ALDACTONE) 25 MG tablet TAKE 1 TABLET (25 MG TOTAL) BY MOUTH DAILY.   No current facility-administered medications on file prior to visit.     Allergies:   Patient has no known allergies.   Social History   Tobacco Use   Smoking status: Never  Substance Use Topics   Alcohol use: Yes    Family History: HTN, diabetes. His paternal grandfather died of a stroke in his 57s. His maternal uncle died in his 74s due to  a stroke.   ROS:   Please see the history of present illness. (+) Neuropathy (+) Mild headaches (+) ED All other systems are reviewed and negative.    EKGs/Labs/Other Studies Reviewed:    The following studies were reviewed today:  CTA Chest 11/20/2021: FINDINGS: Cardiovascular: Examination for pulmonary embolism is somewhat limited by breath motion artifact and marginal contrast bolus, main pulmonary artery = 200 HU. Within this limitation, no evidence of pulmonary embolism through the segmental pulmonary arterial level. Cardiomegaly. No pericardial effusion. Aortic atherosclerosis.   Mediastinum/Nodes: No enlarged mediastinal, hilar, or axillary lymph nodes. Thyroid gland, trachea, and esophagus demonstrate no significant findings.   Lungs/Pleura: Lungs are clear. No pleural effusion or pneumothorax.   Upper Abdomen: Please see separately reported examination of the abdomen and pelvis.   Musculoskeletal: No chest wall abnormality. No acute osseous findings.   Review of the MIP images confirms the above findings.   IMPRESSION: 1. Examination for pulmonary embolism is somewhat limited by breath motion artifact and marginal contrast bolus, main pulmonary artery = 200 HU. Within this limitation, no evidence of pulmonary embolism through the segmental pulmonary arterial level. 2. Cardiomegaly.   Aortic Atherosclerosis (ICD10-I70.0).  EKG:  EKG is personally reviewed.   02/08/2023:  NSR at 85 bpm, PRWP, Q in III 02/02/2022: EKG was not ordered. 12/26/2021: NSR at 100 bpm  Recent Labs: No results found for requested labs within last 365 days.   Recent Lipid Panel    Component Value Date/Time   CHOL 210 (H) 08/07/2022 1624   TRIG 310 (H) 08/07/2022 1624   HDL 36 (L) 08/07/2022 1624   CHOLHDL 5.8 (H) 08/07/2022 1624   LDLCALC 120 (H) 08/07/2022 1624    Physical Exam:    VS:  BP 110/72 (BP Location: Left Arm, Patient Position: Sitting, Cuff Size: Large)   Pulse 85    Ht 5\' 11"  (1.803 m)   Wt 283 lb 12.8 oz (128.7 kg)   BMI 39.58 kg/m     Wt Readings from Last 3 Encounters:  02/08/23 283 lb 12.8 oz (128.7 kg)  02/02/22 276 lb 4.8 oz (125.3 kg)  12/26/21 275 lb 6.4 oz (124.9 kg)    GEN: Well nourished, well developed in no acute distress HEENT: Normal, moist mucous membranes NECK: No JVD CARDIAC: regular rhythm, normal S1 and S2, no rubs or gallops. No murmur. VASCULAR: Radial and DP pulses 2+ bilaterally. No carotid bruits RESPIRATORY:  Clear to auscultation without rales, wheezing or rhonchi  ABDOMEN: Soft, non-tender, non-distended MUSCULOSKELETAL:  Ambulates independently SKIN: Warm and dry, no edema NEUROLOGIC:  Alert and oriented x 3. No focal neuro deficits noted. PSYCHIATRIC:  Normal affect    ASSESSMENT:    1. Essential hypertension   2. Mixed hyperlipidemia   3. Type 2 diabetes mellitus with diabetic neuropathy, without long-term  current use of insulin   4. Cardiac risk counseling     PLAN:    Hypertension -BP now normalized with changing HCTZ to chlorthalidone and adding spironolactone -continue amlodipine-olmesartan  LE edema: improved today -pioglitazone is known to be associated with fluid retention, but he has not tolerated many other classes of diabetes agents. No known heart failure  Type II diabetes Obesity, BMI 38 -did not tolerate Trulicty (GLP1RA) due to nausea. Reports being on Jardiance in the past, per notes no side effects but did not improve his blood sugar -discussed aspirin given diabetes; he is undergoing GI workup, but would recommend starting aspirin 81 mg daily once GI workup complete if no bleeding noted  Mixed hyperlipidemia: -on atorvastatin 80 mg daily -llipids 06/11/22 per care everywhere: Tchol 248, LDL 160, HDL 60, TG 204. Was eating a lot of meat at the time -counseled on diet and especially exercise -will get recheck with his PCP -if TG remain elevated, will need to discuss additional TG  lowering therapies  Cardiac risk counseling and prevention recommendations: -recommend heart healthy/Mediterranean diet, with whole grains, fruits, vegetable, fish, lean meats, nuts, and olive oil. Limit salt. -recommend moderate walking, 3-5 times/week for 30-50 minutes each session. Aim for at least 150 minutes.week. Goal should be pace of 3 miles/hours, or walking 1.5 miles in 30 minutes -recommend avoidance of tobacco products. Avoid excess alcohol. -ASCVD risk score: The 10-year ASCVD risk score (Arnett DK, et al., 2019) is: 7.4%   Values used to calculate the score:     Age: [age] years     Sex: Male     Is Non-Hispanic African American: No     Diabetic: Yes     Tobacco smoker: No     Systolic Blood Pressure: 110 mmHg     Is BP treated: Yes     HDL Cholesterol: 51 mg/dL     Total Cholesterol: 193 mg/dL    Plan for follow up: 1 year if BP/TG controlled, sooner if needed.  Jodelle RedBridgette Kippy Melena, MD, PhD, [facility] Asc LtdFACC [place]  [facility]    Medication Adjustments/Labs and Tests Ordered: Current medicines are reviewed at length with the patient today.  Concerns regarding medicines are outlined above.   Orders Placed This Encounter  Procedures   EKG 12-Lead   No orders of the defined types were placed in this encounter.  Patient Instructions  Medication Instructions:  Your physician recommends that you continue on your current medications as directed. Please refer to the Current Medication list given to you today.  *If you need a refill on your cardiac medications before your next appointment, please call your pharmacy*  Lab Work: NONE  Testing/Procedures: NONE  Follow-Up: At [facility], you and your health needs are our priority.  As part of our continuing mission to provide you with exceptional heart care, we have created designated Provider Care Teams.  These Care Teams include your primary Cardiologist (physician) and Advanced Practice Providers (APPs -   Physician Assistants and Nurse Practitioners) who all work together to provide you with the care you need, when you need it.  We recommend signing up for the patient portal called "MyChart".  Sign up information is provided on this After Visit Summary.  MyChart is used to connect with patients for Virtual Visits (Telemedicine).  Patients are able to view lab/test results, encounter notes, upcoming appointments, etc.  Non-urgent messages can be sent to your provider as well.   To learn more about what you can  do with MyChart, go to [email or web address].    Your next appointment:   12  month(s)  The format for your next appointment:   In Person  Provider:   Jodelle Red, MD    Once your GI workup is complete, consider starting aspirin 81 mg daily for prevention given your diabetes.    I,Mathew Stumpf,acting as a Neurosurgeon for [employer], MD.,have documented all relevant documentation on the behalf of Jodelle Red, MD,as directed by  Jodelle Red, MD while in the presence of Jodelle Red, MD.  I, Jodelle Red, MD, have reviewed all documentation for this visit. The documentation on 02/08/23 for the exam, diagnosis, procedures, and orders are all accurate and complete.   Signed, Jodelle Red, MD PhD 02/08/2023   [facility]

## 2023-02-21 ENCOUNTER — Encounter (HOSPITAL_BASED_OUTPATIENT_CLINIC_OR_DEPARTMENT_OTHER): Payer: Self-pay | Admitting: Cardiology

## 2023-04-18 ENCOUNTER — Other Ambulatory Visit (HOSPITAL_BASED_OUTPATIENT_CLINIC_OR_DEPARTMENT_OTHER): Payer: Self-pay | Admitting: Cardiology

## 2023-04-18 DIAGNOSIS — I1A0 Resistant hypertension: Secondary | ICD-10-CM

## 2023-04-20 NOTE — Telephone Encounter (Signed)
Rx request sent to pharmacy.  

## 2023-09-13 IMAGING — CT CT HEAD W/O CM
3 series · 15 of 47 positions shown, 18 images · non-contrast
Comparison: CT head 04/21/2021

CLINICAL DATA: Dizziness, right arm pain, shortness of breath,
nausea



[Series 2: head w o · axial · 0.49mm/px · z∈[+1571,+1716]mm · 9 of 35 slices shown, 12 images]
[im 3/35  brain]
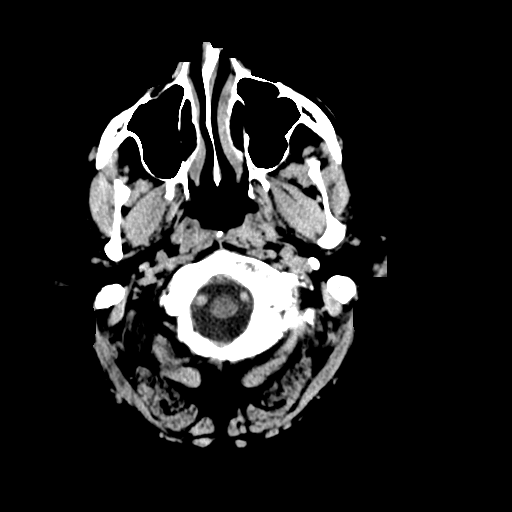
[im 3/35  bone]
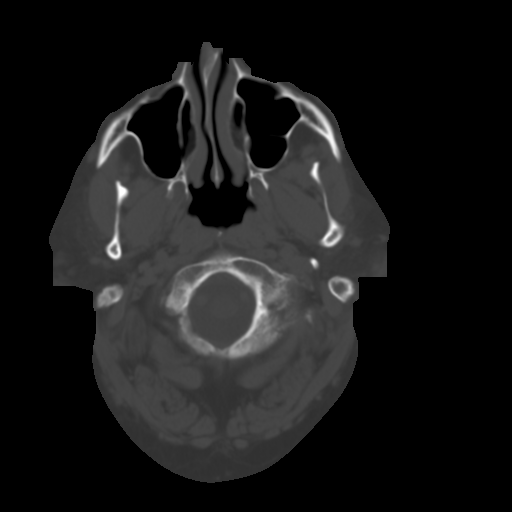
[im 6/35  brain]
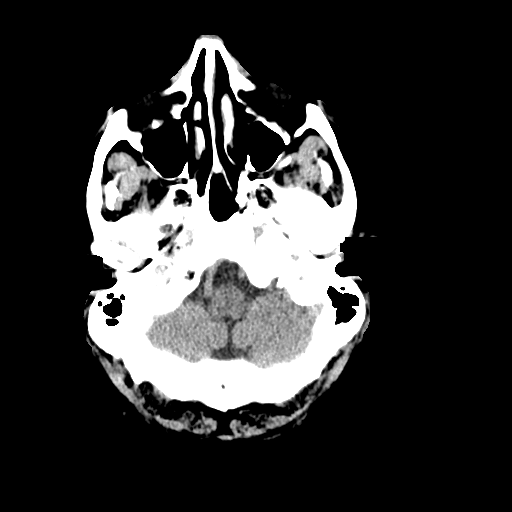
[im 10/35  brain]
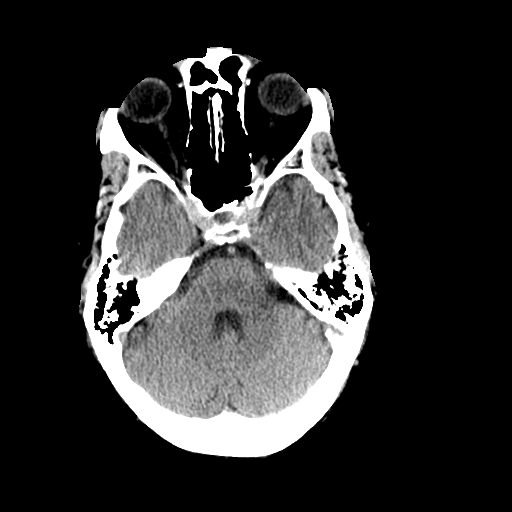
[im 13/35  brain]
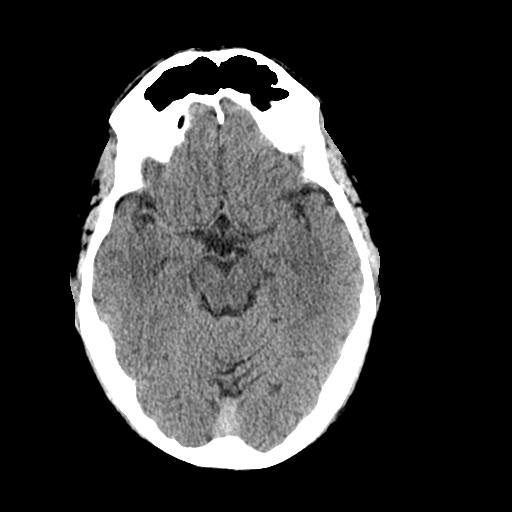
[im 18/35  brain]
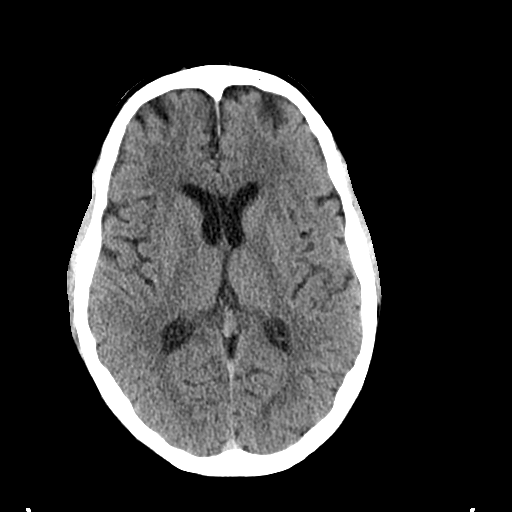
[im 18/35  bone]
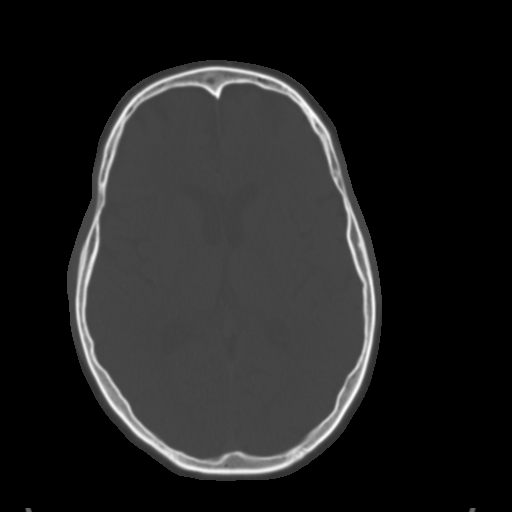
[im 22/35  brain]
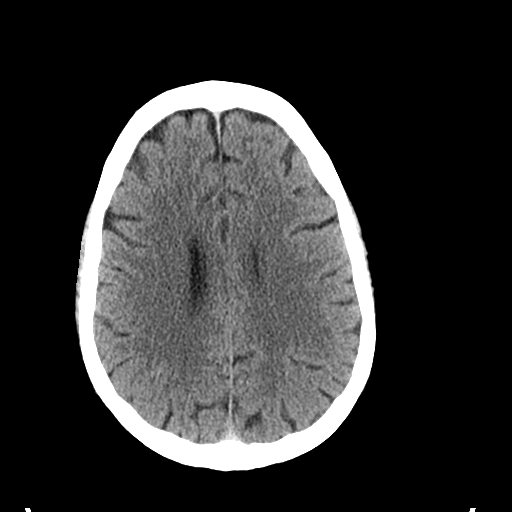
[im 25/35  brain]
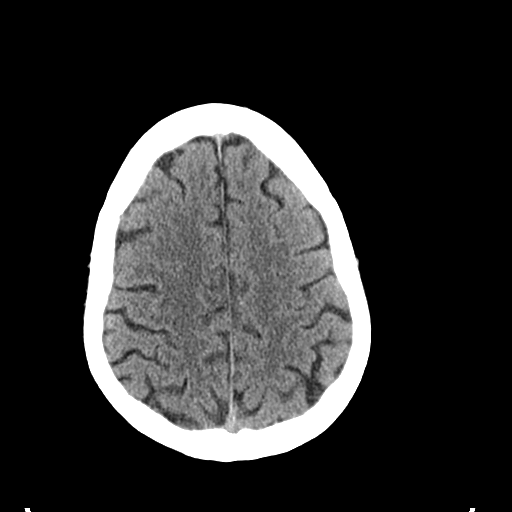
[im 29/35  brain]
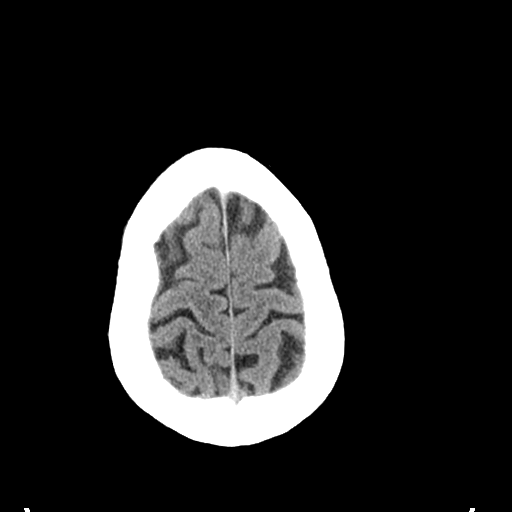
[im 32/35  brain]
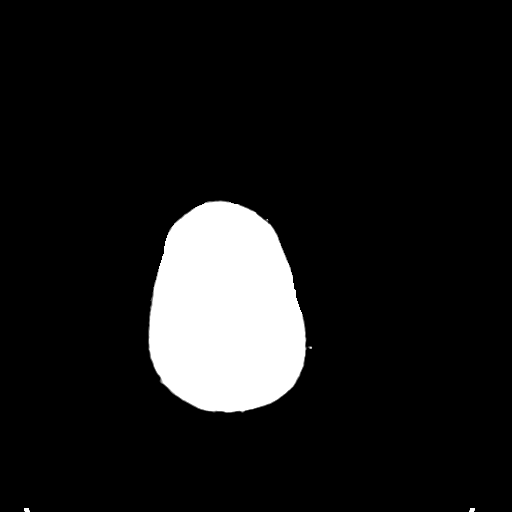
[im 32/35  bone]
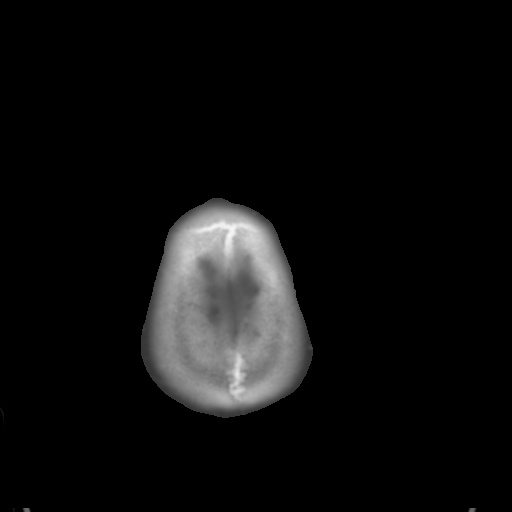

[Series 4: coronal soft · coronal · 0.36mm/px · 3 of 84 slices shown]
[im 28/84  brain]
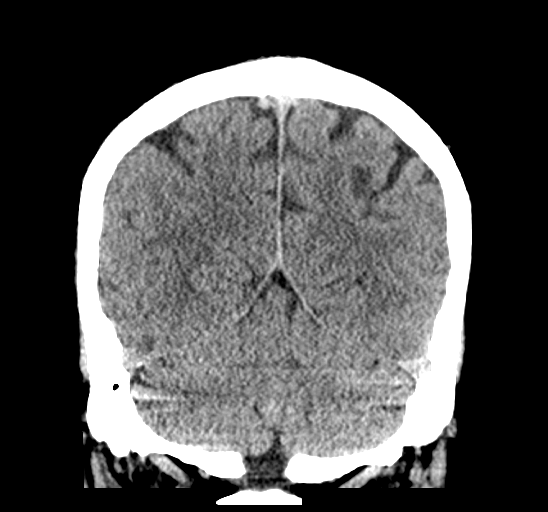
[im 37/84  brain]
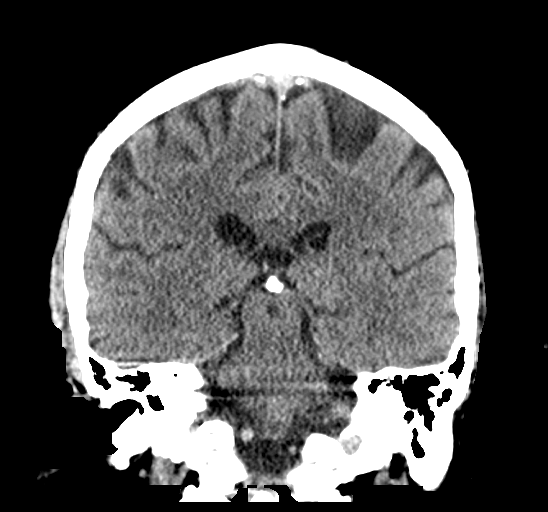
[im 47/84  brain]
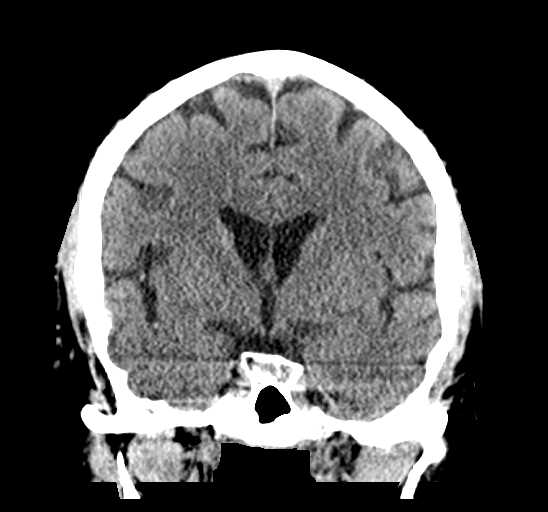

[Series 5: sagittal soft · sagittal · 0.39mm/px · 3 of 66 slices shown]
[im 22/66  brain]
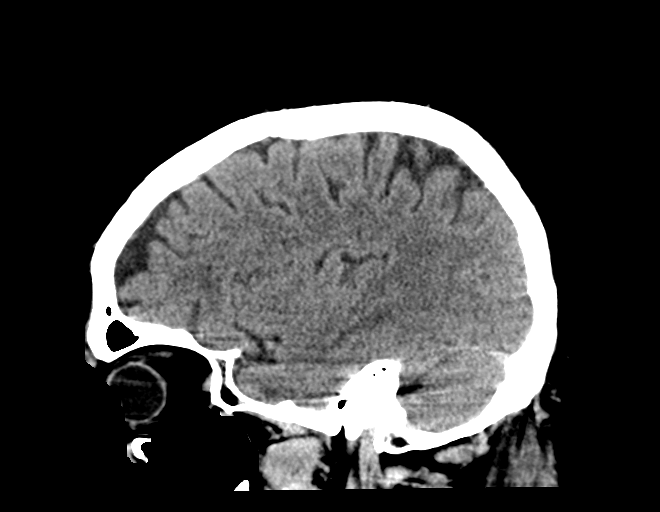
[im 33/66  brain]
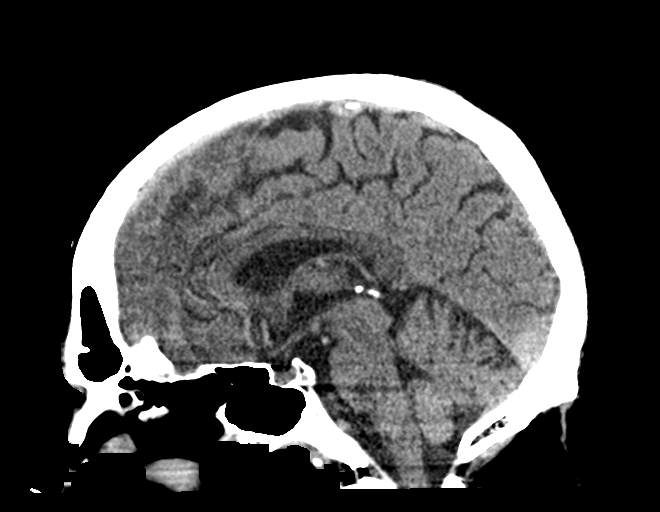
[im 44/66  brain]
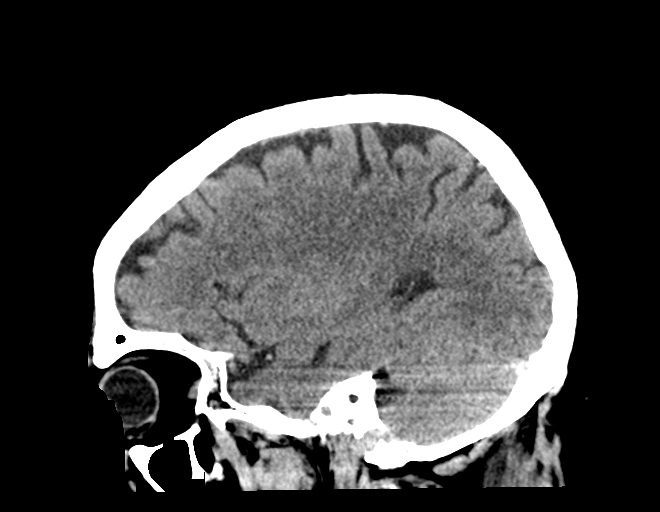

[15 of 47 positions shown; findings below may reference images not displayed]

FINDINGS: Brain: There is no evidence of acute intracranial hemorrhage,
extra-axial fluid collection, or acute infarct.

Parenchymal volume is normal. The ventricles are normal in size.
Parenchyma is normal in appearance. There is no mass lesion. There
is no midline shift.

Vascular: No hyperdense vessel or unexpected calcification.

Skull: Normal. Negative for fracture or focal lesion.

Sinuses/Orbits: The imaged paranasal sinuses are clear. The globes
and orbits are unremarkable.

Other: None.
IMPRESSION: Normal head CT.

## 2023-10-25 ENCOUNTER — Telehealth (HOSPITAL_BASED_OUTPATIENT_CLINIC_OR_DEPARTMENT_OTHER): Payer: Self-pay | Admitting: Cardiology

## 2023-10-25 DIAGNOSIS — I1A0 Resistant hypertension: Secondary | ICD-10-CM

## 2023-10-25 MED ORDER — CHLORTHALIDONE 25 MG PO TABS: 25.0000 mg | ORAL_TABLET | Freq: Every day | ORAL | 1 refills | Status: DC

## 2023-10-25 NOTE — Telephone Encounter (Signed)
*  STAT* If patient is at the pharmacy, call can be transferred to refill team.   1. Which medications need to be refilled? (please list name of each medication and dose if known) Chlorthalidone   2. Would you like to learn more about the convenience, safety, & potential cost savings by using the [facility]?     3. Are you open to using the [facility] (Type [facility].    4. Which pharmacy/location (including street and city if local pharmacy) is medication to be sent to? [facility] [place]   5. Do they need a 30 day or 90 day supply? 90 days and refills

## 2023-11-03 ENCOUNTER — Other Ambulatory Visit (HOSPITAL_BASED_OUTPATIENT_CLINIC_OR_DEPARTMENT_OTHER): Payer: Self-pay | Admitting: Cardiology

## 2023-11-03 DIAGNOSIS — I1A0 Resistant hypertension: Secondary | ICD-10-CM

## 2024-02-07 ENCOUNTER — Ambulatory Visit (HOSPITAL_BASED_OUTPATIENT_CLINIC_OR_DEPARTMENT_OTHER): Admitting: Family

## 2024-02-07 ENCOUNTER — Encounter (HOSPITAL_BASED_OUTPATIENT_CLINIC_OR_DEPARTMENT_OTHER): Payer: Self-pay | Admitting: Family

## 2024-02-07 VITALS — BP 100/80 | HR 95 | Ht 71.0 in | Wt 267.0 lb

## 2024-02-07 DIAGNOSIS — I10 Essential (primary) hypertension: Secondary | ICD-10-CM

## 2024-02-07 DIAGNOSIS — E119 Type 2 diabetes mellitus without complications: Secondary | ICD-10-CM | POA: Diagnosis not present

## 2024-02-07 DIAGNOSIS — R002 Palpitations: Secondary | ICD-10-CM

## 2024-02-07 DIAGNOSIS — E782 Mixed hyperlipidemia: Secondary | ICD-10-CM

## 2024-02-07 MED ORDER — METOPROLOL TARTRATE 25 MG PO TABS: 25.0000 mg | ORAL_TABLET | Freq: Two times a day (BID) | ORAL | 2 refills | Status: DC | PRN

## 2024-02-07 NOTE — Progress Notes (Signed)
 Cardiology Office Note:  .   Date:  02/07/2024  ID:  Scott Foster, DOB 10/05/1971, MRN [id number] PCP: Gwenlyn Found, MD  [facility] Providers Cardiologist:  Jodelle Red, MD    History of Present Illness: .   Scott Foster is a 53 y.o. male with history of hypertension (diagnosed 2005), arthritis, acid reflux, DM2, remote tobacco use having quit in 1992.  Family history of hypertension, stroke, diabetes, diabetic neuropathy.  Established with Dr. Cristal Deer 12/26/2021.  At most recent office visit/28/24 it was noted that his blood pressure was controlled at with changing HCTZ to chlorthalidone and adding spironolactone.  Amlodipine-olmesartan was also continued.  He did have some lower extremity edema which was improved which may have been related to Actos but had not tolerated many other classes of diabetes agents.  Labs via [facility] Everywhere 01/2023 total cholesterol 193, triglycerides 186, HDL 51, LDL 118.  Hospitalized 2/23 - 12/29/2023 for bacteremia, vomiting, fever and chills with blood culture with gram-negative bacilli treated with Zosyn.  He had hypokalemia and hyponatremia which were repleted.  Presents today for follow-up.  Weight one year ago 283 and weight now 267 lbs. Has not taken cardiac medication since Thursday as BP at PCP office was 87/58. Notes this has gradually improved his lightheadness. No near syncope, syncope. Reports very mild bilateral lower extremity edema. Reports intermittent blood in his stool only when he has constipation but no hematuria. No formal exercise regimen, but tries to stay active while at work. Following  low sodium diet..   Previous antihypertensive HCTZ-transition to chlorthalidone  ROS: Please see the history of present illness.    All other systems reviewed and are negative.   Studies Reviewed: Marland Kitchen   EKG Interpretation Date/Time:  Monday February 07 2024 15:31:46 EDT Ventricular Rate:  95 PR Interval:  198 QRS  Duration:  88 QT Interval:  358 QTC Calculation: 449 R Axis:   -2  Text Interpretation: Normal sinus rhythm  No acute ST/T wave changes Confirmed by Gillian Shields ([id number]) on 02/07/2024 3:45:39 PM        Risk Assessment/Calculations:             Physical Exam:   VS:  BP 100/80 (BP Location: Right Arm, Patient Position: Sitting, Cuff Size: Large)   Pulse 95   Ht 5\' 11"  (1.803 m)   Wt 267 lb (121.1 kg)   SpO2 95%   BMI 37.24 kg/m    Wt Readings from Last 3 Encounters:  02/07/24 267 lb (121.1 kg)  02/08/23 283 lb 12.8 oz (128.7 kg)  02/02/22 276 lb 4.8 oz (125.3 kg)    GEN: Well nourished, well developed in no acute distress NECK: No JVD; No carotid bruits CARDIAC: RRR, no murmurs, rubs, gallops RESPIRATORY:  Clear to auscultation without rales, wheezing or rhonchi  ABDOMEN: Soft, non-tender, non-distended EXTREMITIES:  No edema; No deformity   ASSESSMENT AND PLAN: .    Assessment and Plan Assessment & Plan Hypertension now with hypotension Hypotension likely due to recent weight loss and discontinuation of antihypertensive medications. Blood pressure today was 100/80 mmHg, on the lower end of normal. - Discontinue amlodipine-omestartan, chlorthalidone, spironolactone, and metoprolol. - Monitor blood pressure at home two to three times a week. - Use metoprolol as needed for palpitations. - Contact provider if blood pressure consistently falls below 110 mmHg or rises above 130 mmHg. -As lightheadedness improving, defer further workup. If recurs, consider echocardiogram.  Palpitations Occasional palpitations possibly related to dehydration or  Mounjaro use. Metoprolol can be used as needed without significantly affecting blood pressure. - Use metoprolol as needed for palpitations.  Type 2 Diabetes Mellitus Good glycemic control with A1c at 5.9%. Significant weight loss has been beneficial. - Continue Mounjaro and Actos for diabetes management per PCP  Hyperlipidemia On  atorvastatin for cholesterol management, reducing cardiovascular risk, especially with diabetes. - Continue atorvastatin 80mg  daily for cholesterol management.     Dispo: follow up in 6 weeks  Signed, Alver Sorrow, NP

## 2024-02-07 NOTE — Patient Instructions (Addendum)
 Medication Instructions:   STOP Amlodipine-Olmesartan  CHANGE Metoprolol to as-needed for palpitations lasting >10 minutes  STOP Spironolactone  STOP Chlorthalidone  CONTINUE Atorvastatin 80mg  daily  *If you need a refill on your cardiac medications before your next appointment, please call your pharmacy*  Testing/Procedures: Your EKG today looked good!  Follow-Up: At [facility], you and your health needs are our priority.  As part of our continuing mission to provide you with exceptional heart care, our providers are all part of one team.  This team includes your primary Cardiologist (physician) and Advanced Practice Providers or APPs (Physician Assistants and Nurse Practitioners) who all work together to provide you with the care you need, when you need it.  Your next appointment:   6 month(s)  Provider:   Jodelle Red, MD, Eligha Bridegroom, NP, or Gillian Shields, NP    We recommend signing up for the patient portal called "MyChart".  Sign up information is provided on this After Visit Summary.  MyChart is used to connect with patients for Virtual Visits (Telemedicine).  Patients are able to view lab/test results, encounter notes, upcoming appointments, etc.  Non-urgent messages can be sent to your provider as well.   To learn more about what you can do with MyChart, go to [email or web address].   Other Instructions  Tips to Measure your Blood Pressure Correctly  Check blood pressure 2-3 times per week  Here's what you can do to ensure a correct reading:  Don't drink a caffeinated beverage or smoke during the 30 minutes before the test.  Sit quietly for five minutes before the test begins.  During the measurement, sit in a chair with your feet on the floor and your arm supported so your elbow is at about heart level.  The inflatable part of the cuff should completely cover at least 80% of your upper arm, and the cuff should be placed on bare skin, not  over a shirt.  Don't talk during the measurement.  Have your blood pressure measured twice, with a brief break in between. If the readings are different by 5 points or more, have it done a third time.  Blood pressure categories  Blood pressure category SYSTOLIC (upper number)  DIASTOLIC (lower number)  Normal Less than 120 mm Hg and Less than 80 mm Hg  Elevated 120-129 mm Hg and Less than 80 mm Hg  High blood pressure: Stage 1 hypertension 130-139 mm Hg or 80-89 mm Hg  High blood pressure: Stage 2 hypertension 140 mm Hg or higher or 90 mm Hg or higher  Hypertensive crisis (consult your doctor immediately) Higher than 180 mm Hg and/or Higher than 120 mm Hg  Source: American Heart Association and American Stroke Association. For more on getting your blood pressure under control, buy Controlling Your Blood Pressure, a Special Health Report from [facility].  Blood Pressure Log   Date   Time  Blood Pressure  Example: Nov 1 9 AM 124/78

## 2024-05-27 ENCOUNTER — Other Ambulatory Visit (HOSPITAL_BASED_OUTPATIENT_CLINIC_OR_DEPARTMENT_OTHER): Payer: Self-pay | Admitting: Family

## 2024-05-27 DIAGNOSIS — R002 Palpitations: Secondary | ICD-10-CM

## 2024-07-26 ENCOUNTER — Encounter (HOSPITAL_BASED_OUTPATIENT_CLINIC_OR_DEPARTMENT_OTHER): Payer: Self-pay | Admitting: Cardiology

## 2024-07-26 ENCOUNTER — Ambulatory Visit (HOSPITAL_BASED_OUTPATIENT_CLINIC_OR_DEPARTMENT_OTHER): Admitting: Cardiology

## 2024-07-26 VITALS — BP 120/82 | HR 73 | Resp 17 | Ht 71.0 in | Wt 258.0 lb

## 2024-07-26 DIAGNOSIS — E119 Type 2 diabetes mellitus without complications: Secondary | ICD-10-CM

## 2024-07-26 DIAGNOSIS — R002 Palpitations: Secondary | ICD-10-CM

## 2024-07-26 DIAGNOSIS — I10 Essential (primary) hypertension: Secondary | ICD-10-CM

## 2024-07-26 DIAGNOSIS — E782 Mixed hyperlipidemia: Secondary | ICD-10-CM | POA: Diagnosis not present

## 2024-07-26 DIAGNOSIS — Z7189 Other specified counseling: Secondary | ICD-10-CM

## 2024-07-26 MED ORDER — METOPROLOL SUCCINATE ER 50 MG PO TB24: 50.0000 mg | ORAL_TABLET | Freq: Every day | ORAL | 3 refills | Status: AC

## 2024-07-26 MED ORDER — METOPROLOL SUCCINATE ER 50 MG PO TB24: 50.0000 mg | ORAL_TABLET | Freq: Every day | ORAL | 3 refills | Status: DC

## 2024-07-26 NOTE — Progress Notes (Signed)
 Cardiology Office Note:  .   Date:  07/26/2024  ID:  Scott Foster, DOB 1971-05-18, MRN [id number] PCP: Leila Lucie LABOR, MD  [facility] Providers Cardiologist:  Shelda Bruckner, MD {  History of Present Illness: .   Scott Foster is a 53 y.o. male with a hx of hypertension, arthritis, and acid reflux, who is seen for follow-up. I initially met him 12/26/2021 as a new consult for the evaluation and management of palpitations. His blood pressure was elevated at first visit, and he noted labile readings at home.    Cardiovascular risk factors: Hypertension since 2005 Diabetes since ~2022. Did not tolerate Trulicity (nausea) but did have about 30 lb weight loss due to side effects. Has also been on Jardiance in the past.  Former smoker, quit in 1992. Family history: HTN, diabetes. His paternal grandfather died of a stroke in his 51s. His maternal uncle died in his 58s due to a stroke.   Today: Reviewed hospitalization in February for gram-negative bacteremia. Had hypotension post hospitalization, medications were held for a time. After about of month of holding medications, his blood pressure started to rise again. Restarted amlodipine first. Has not restarted olmesartan, spironolactone  or chlorthalidone . Doesn't check BP at home.   Was started on metoprolol  for palpitations in April. Still having intermittent palpitations on metoprolol . Currently taking metoprolol  BID.   Has changed from day to night shift, his body is struggling to adjust to this, feels like he is sleeping a lot more.   ROS: Denies chest pain, shortness of breath at rest or with normal exertion. No PND, orthopnea, LE edema or unexpected weight gain. No syncope. ROS otherwise negative except as noted.   Studies Reviewed: SABRA    EKG:       Physical Exam:   VS:  BP 120/82 (Cuff Size: Large)   Pulse 73   Resp 17   Ht 5' 11 (1.803 m)   Wt 258 lb (117 kg)   SpO2 96%   BMI 35.98 kg/m    Wt Readings from  Last 3 Encounters:  07/26/24 258 lb (117 kg)  02/07/24 267 lb (121.1 kg)  02/08/23 283 lb 12.8 oz (128.7 kg)    GEN: Well nourished, well developed in no acute distress HEENT: Normal, moist mucous membranes NECK: No JVD CARDIAC: regular rhythm, normal S1 and S2, no rubs or gallops. No murmur. VASCULAR: Radial and DP pulses 2+ bilaterally. No carotid bruits RESPIRATORY:  Clear to auscultation without rales, wheezing or rhonchi  ABDOMEN: Soft, non-tender, non-distended MUSCULOSKELETAL:  Ambulates independently SKIN: Warm and dry, no edema NEUROLOGIC:  Alert and oriented x 3. No focal neuro deficits noted. PSYCHIATRIC:  Normal affect    ASSESSMENT AND PLAN: .    Hypertension, with hypotension post hospitalization -at goal today on only amlodipine 5 mg daily and metoprolol  -Since his hospitalization, chlorthalidone , spironolactone , olmesartan on hold -has significant weight loss, may not require re-addition of these meds, will monitor   Palpitations -changing metoprolol  tartrate 25 mg BID to metoprolol  succinate 50 mg daily at bedtime (worst palpitations are in AM).  Type II diabetes Obesity, BMI now 36. Down about 42 lbs from his peak -did not tolerate Trulicty (GLP1RA) due to nausea, now on Mounjaro. Reports being on Jardiance in the past, per notes no side effects but did not improve his blood sugar -discussed aspirin given diabetes; he had ulcer on scope, will hold off on this -pioglitazone is known to be associated with fluid retention, but  he has not tolerated many other classes of diabetes agents. No known heart failure   Mixed hyperlipidemia: -on atorvastatin 80 mg daily. Feels like he has muscle/joint aches. We discussed importance of statins for risk reduction, offered to change to rosuvastatin to see if this improves. He will contact me if he wishes to make the change.  -llipids 06/07/24 per care everywhere: Tchol 129, LDL 67, HDL 38, TG 160.  -counseled on diet and  exercise. TG improving with weight loss, continue to monitor  CV risk counseling and prevention -recommend heart healthy/Mediterranean diet, with whole grains, fruits, vegetable, fish, lean meats, nuts, and olive oil. Limit salt. -recommend moderate walking, 3-5 times/week for 30-50 minutes each session. Aim for at least 150 minutes/week. Goal should be pace of 3 miles/hours, or walking 1.5 miles in 30 minutes -recommend avoidance of tobacco products. Avoid excess alcohol.  Dispo: 6 mos or sooner as needed  Signed, Shelda Bruckner, MD   Shelda Bruckner, MD, PhD, [facility]  [facility], [facility]  [place]  [facility] at [facility] at [facility] [address] [place], [place] [address] [phone]

## 2024-07-26 NOTE — Patient Instructions (Signed)
 Medication Instructions:  Your physician has recommended you make the following change in your medication:  1.) stop metoprolol  tartrate 2.) start metoprolol  succinate (Toprol  XL) 50 mg - take one tablet daily  *If you need a refill on your cardiac medications before your next appointment, please call your pharmacy*  Lab Work: none  Testing/Procedures: none  Follow-Up: At [facility], you and your health needs are our priority.  As part of our continuing mission to provide you with exceptional heart care, our providers are all part of one team.  This team includes your primary Cardiologist (physician) and Advanced Practice Providers or APPs (Physician Assistants and Nurse Practitioners) who all work together to provide you with the care you need, when you need it.  Your next appointment:   6 month(s)  Provider:   Shelda Bruckner, MD, Rosaline Bane, NP, or Reche Finder, NP
